# Patient Record
Sex: Male | Born: 2000 | Race: White | Hispanic: No | Marital: Single | Smoking: Never smoker
Health system: Southern US, Community
[De-identification: ages and names within clinical notes are randomized; demographics above are authoritative.]

## PROBLEM LIST (undated history)

## (undated) DIAGNOSIS — I10 Essential (primary) hypertension: Secondary | ICD-10-CM

## (undated) DIAGNOSIS — Z8774 Personal history of (corrected) congenital malformations of heart and circulatory system: Secondary | ICD-10-CM

## (undated) DIAGNOSIS — R197 Diarrhea, unspecified: Secondary | ICD-10-CM

## (undated) HISTORY — DX: Personal history of (corrected) congenital malformations of heart and circulatory system: Z87.74

## (undated) HISTORY — PX: CORONARY STENT PLACEMENT: SHX1402

## (undated) HISTORY — DX: Diarrhea, unspecified: R19.7

## (undated) HISTORY — PX: COARCTATION OF AORTA REPAIR: SHX261

## (undated) HISTORY — DX: Essential (primary) hypertension: I10

---
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
## 2019-08-03 ENCOUNTER — Telehealth: Payer: Self-pay | Admitting: Cardiovascular Disease

## 2019-08-03 MED ORDER — ENALAPRIL MALEATE 20 MG PO TABS: 20.0000 mg | ORAL_TABLET | Freq: Every day | ORAL | 0 refills | Status: DC

## 2019-08-03 MED ORDER — ATENOLOL 50 MG PO TABS: 50.0000 mg | ORAL_TABLET | Freq: Every day | ORAL | 0 refills | Status: DC

## 2019-08-03 NOTE — Telephone Encounter (Signed)
  *  STAT* If patient is at the pharmacy, call can be transferred to refill team.   1. Which medications need to be refilled? (please list name of each medication and dose if known) atenolol (TENORMIN) 50 MG tablet enalapril (VASOTEC) 20 MG tablet  2. Which pharmacy/location (including street and city if local pharmacy) is medication to be sent to? [facility] - [place], [place] - [address]  3. Do they need a 30 day or 90 day supply? 90 days

## 2019-08-03 NOTE — Telephone Encounter (Signed)
Pt's medication was sent to pt's pharmacy as requested. Confirmation received.  °

## 2019-08-17 ENCOUNTER — Telehealth: Payer: Self-pay | Admitting: Cardiovascular Disease

## 2019-08-17 NOTE — Telephone Encounter (Signed)
I have referred him to the [facility] since we do not follow patients with coarctation of the aorta .  Copied from my July 2020 phone note with his father:  .   I have discussed with partners and with [facility]. None of our partners follow Coarctation of the aorta regularly and Wesley Estrada should be followed by an Adult Congenital Heart disease specialist.    Please make a referral to the [facility]. Dr. Darlis Loan or Dr. Payton Emerald or other appropriate partner.    [address] [place], [place]  [address]    Please fax my last office visit and any other available records. .   I have informed the [facility] scheduler that we do not have imaging or much information that is not already available in Care Everywhere.    I have spoken to Wesley Estrada's father Wesley Estrada) who appreciates the referral to [facility] . I have asked Wesley Estrada to cancel the CT angio scheduled for November 26, 2018 and have informed the patient not to show up for that.      I will be happy to see him for any emergencies until he is established with the [facility] .      Phone [phone]  He needs to follow up with Dr. Mayer Camel or Dr. Morene Rankins .   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   Thanks   PN

## 2019-08-17 NOTE — Telephone Encounter (Signed)
New message  Patient is calling in to speak with Dr. Elease Hashimoto. States that he has some questions that he would like answered by Dr. Elease Hashimoto. Please give patient a call back to assist.

## 2019-08-17 NOTE — Telephone Encounter (Signed)
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  He would like to know with his cardiac hx (aorta stenting & HTN) if this would prevent him from obtaining license and/or clearance from cardiology standpoint. He doesn't want to go through process if cardiology feels he would be unsafe/clear him. Pt aware Dr. Elease Hashimoto and his nurse are out of the office today but will forward to them to address upon return to office. Aware we will call back once Nahser has reviewed.

## 2019-08-17 NOTE — Telephone Encounter (Addendum)
Pt made aware of Dr. Elease Hashimoto comment/previous referral. Pt states that at the time he did not have insurance and was unable to afford visit.  He had insurance now and would like referral again to get process going again. Will forward to Eligha Bridegroom, RN to aid in referral and let pt know when completed.  He also is due for his yearly follow up and would like to schedule that when she calls.  (he has insurance for about 6 months, and states they will not cover a visit before then, so schedule for June or after).  Thirdly, he wants to make sure that Dr. Elease Hashimoto feels that he would approve him for CDL with his HTN hx.  He understands Marcelino Duster will discuss this with Nahser and let him know.  He understands that only the [facility] (once seen/established) will be able to give clearance from aorta aspect of medical hx. He understands Marcelino Duster will follow up with him when she returns to the office.   Would be able to do, or if I am just wasting my time

## 2019-08-18 ENCOUNTER — Encounter: Payer: Self-pay | Admitting: Nurse Practitioner

## 2019-08-18 NOTE — Telephone Encounter (Signed)
Referral faxed to [facility] [phone].

## 2019-08-18 NOTE — Telephone Encounter (Signed)
Spoke with patient and advised him that per Dr. 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 He reports SBP is ranging from 112-115 mmHg on medication. He is aware that I will fax the referral to [facility] and that he should get a call from them to schedule an appointment. I advised him to call back with questions or concerns and he thanked me for the call.

## 2019-08-29 ENCOUNTER — Ambulatory Visit: Payer: Self-pay | Admitting: Cardiovascular Disease

## 2019-10-31 ENCOUNTER — Other Ambulatory Visit: Payer: Self-pay | Admitting: *Deleted

## 2019-10-31 MED ORDER — ENALAPRIL MALEATE 20 MG PO TABS: 20.0000 mg | ORAL_TABLET | Freq: Every day | ORAL | 0 refills | Status: DC

## 2019-11-11 ENCOUNTER — Encounter: Payer: Self-pay | Admitting: Cardiovascular Disease

## 2019-11-11 ENCOUNTER — Other Ambulatory Visit: Payer: Self-pay

## 2019-11-11 ENCOUNTER — Ambulatory Visit: Payer: BC Managed Care – PPO | Admitting: Cardiovascular Disease

## 2019-11-11 VITALS — BP 102/66 | HR 52 | Ht 65.0 in | Wt 143.8 lb

## 2019-11-11 DIAGNOSIS — Q251 Coarctation of aorta: Secondary | ICD-10-CM | POA: Diagnosis not present

## 2019-11-11 LAB — BASIC METABOLIC PANEL
BUN/Creatinine Ratio: 20 (ref 9–20)
BUN: 17 mg/dL (ref 6–20)
CO2: 23 mmol/L (ref 20–29)
Calcium: 9.4 mg/dL (ref 8.7–10.2)
Chloride: 102 mmol/L (ref 96–106)
Creatinine, Ser: 0.87 mg/dL (ref 0.76–1.27)
GFR calc Af Amer: 145 mL/min/{1.73_m2} (ref >59)
GFR calc non Af Amer: 125 mL/min/{1.73_m2} (ref >59)
Glucose: 80 mg/dL (ref 65–99)
Potassium: 4.3 mmol/L (ref 3.5–5.2)
Sodium: 138 mmol/L (ref 134–144)

## 2019-11-11 NOTE — Patient Instructions (Addendum)
Medication Instructions:  No changes *If you need a refill on your cardiac medications before your next appointment, please call your pharmacy*   Lab Work: Today---bmet  Testing/Procedures: CT Angiography (CTA), is a special type of CT scan that uses a computer to produce multi-dimensional views of major blood vessels throughout the body. In CT angiography, a contrast material is injected through an IV to help visualize the blood vessels  Follow-Up: Your physician recommends that you schedule a follow-up appointment AS NEEDED WITH DR. Elease Hashimoto.   Other Instructions

## 2019-11-11 NOTE — Progress Notes (Signed)
Cardiology Office Note:    Date:  11/11/2019   ID:  Wesley Estrada, DOB 08/13/2000, MRN [id number]  PCP:  Pennie Rushing, MD  Cardiologist: Reola Calkins Kenrick Pore  Electrophysiologist:  None   Referring MD: Pennie Rushing, MD   Chief Complaint  Patient presents with   Follow-up    History of Present Illness:    Wesley Estrada is a 19 y.o. male with a hx of coarctation of aorta.   Surgical correctinat WFBU at age 1 monhts. Had stenting of the aorta age 24.    Has had CT angio yearly to follow the aortic stent.  Last CT angio was a year ago   Active.  Has taken welding classes.   Works in [employer] now   No CP or dyspnea  takes his BP regularly . 120/68 typically.   Non smoker, no ETOH.    November 11, 2019: Body seen today for follow-up of history of coarctation of his aorta with surgical correction.  He has been referred to the [facility]. We ordered a CT angiogram of the chest and aorta with and without contrast but this has not been done yet.  He did not have insurance last year so he has not gotten the CT scan or seen Dr. Vella Kohler at [facility] congenital [facility] clinic .   No CP or dyspnea.     Past Medical History:  Diagnosis Date   Diarrhea    H/O coarctation of aorta    HTN (hypertension)     Past Surgical History:  Procedure Laterality Date   COARCTATION OF AORTA REPAIR     CORONARY STENT PLACEMENT      Current Medications: Current Meds  Medication Sig   amoxicillin (AMOXIL) 500 MG capsule Take 4 capsules by mouth as needed. Take 1 hr prior to dental appt   atenolol (TENORMIN) 50 MG tablet Take 1 tablet (50 mg total) by mouth daily. Please keep upcoming appt in June with Dr. Acie Fredrickson for future refills. Thank you   enalapril (VASOTEC) 20 MG tablet Take 1 tablet (20 mg total) by mouth daily. Please keep upcoming appt for further refills. Thank you     Allergies:   Patient has no known allergies.   Social History   Socioeconomic History   Marital  status: Single    Spouse name: Not on file   Number of children: Not on file   Years of education: Not on file   Highest education level: Not on file  Occupational History   Not on file  Tobacco Use   Smoking status: Never Smoker   Smokeless tobacco: Never Used  Substance and Sexual Activity   Alcohol use: Not on file   Drug use: Not on file   Sexual activity: Not on file  Other Topics Concern   Not on file  Social History Narrative   Not on file   Social Determinants of Health   Financial Resource Strain:    Difficulty of Paying Living Expenses:   Food Insecurity:    Worried About [place] in the Last Year:    [place] in the Last Year:   Transportation Needs:    Film/video editor (Medical):    Lack of Transportation (Non-Medical):   Physical Activity:    Days of Exercise per Week:    Minutes of Exercise per Session:   Stress:    Feeling of Stress :   Social Connections:    Frequency  of Communication with Friends and Family:    Frequency of Social Gatherings with Friends and Family:    Attends Religious Services:    Active Member of Clubs or Organizations:    Attends Engineer, structural:    Marital Status:      Family History: The patient's family history includes Heart attack in his maternal grandmother and mother; Heart disease in his maternal grandmother; Hypertension in his father, maternal aunt, maternal grandfather, maternal grandmother, and mother.  ROS:   Please see the history of present illness.     All other systems reviewed and are negative.  EKGs/Labs/Other Studies Reviewed:    The following studies were reviewed today:   EKG:   November 11, 2019: Sinus bradycardia 53.  No ST or T wave changes.  Recent Labs: No results found for requested labs within last 8760 hours.  Recent Lipid Panel No results found for: CHOL, TRIG, HDL, CHOLHDL, VLDL, LDLCALC, LDLDIRECT  Physical Exam:     Physical  Exam: Blood pressure 102/66, pulse (!) 52, height 5\' 5"  (1.651 m), weight 143 lb 12.8 oz (65.2 kg), SpO2 98 %.  GEN:  Well nourished, well developed in no acute distress HEENT: Normal NECK: No JVD; No carotid bruits LYMPHATICS: No lymphadenopathy CARDIAC: RRR , no murmurs, rubs, gallops RESPIRATORY:  Clear to auscultation without rales, wheezing or rhonchi  ABDOMEN: Soft, non-tender, non-distended MUSCULOSKELETAL:  No edema; No deformity .  Reduced distal foot pulses.  SKIN: Warm and dry NEUROLOGIC:  Alert and oriented x 3     ASSESSMENT:    1. Coarctation of aorta    PLAN:    In order of problems listed above:  1. Coarctation of descending aorta :   S/p surgical repair at 18 months and then stenting at age 89.    Asymptomatic.   His last echo was with Dr. 14 in 2019 .    He has a persistent aortic gradient of 38 mmHg with a mean gradient of 20 mmHg.   Will get a CT scan Will refer him again to Dr. 2020 for further follow up   He needs CDL paperwork filled out.   I've agreed to fill out the cardiology section of this for this year.   He will need to have Dr. Mayo Ao complete this in future years.      Medication Adjustments/Labs and Tests Ordered: Current medicines are reviewed at length with the patient today.  Concerns regarding medicines are outlined above.  Orders Placed This Encounter  Procedures   CT Angio Chest/Abd/Pel for Dissection W and/or W/WO   Basic metabolic panel   Ambulatory referral to Cardiology   EKG 12-Lead   No orders of the defined types were placed in this encounter.      Patient Instructions  Medication Instructions:  No changes *If you need a refill on your cardiac medications before your next appointment, please call your pharmacy*   Lab Work: Today---bmet  Testing/Procedures: CT Angiography (CTA), is a special type of CT scan that uses a computer to produce multi-dimensional views of major blood vessels throughout the  body. In CT angiography, a contrast material is injected through an IV to help visualize the blood vessels  Follow-Up: Your physician recommends that you schedule a follow-up appointment AS NEEDED WITH DR. Mayo Ao.   Other Instructions      Signed, Elease Hashimoto, MD  11/11/2019 10:47 AM    [facility]

## 2019-11-16 ENCOUNTER — Ambulatory Visit (INDEPENDENT_AMBULATORY_CARE_PROVIDER_SITE_OTHER)
Admission: RE | Admit: 2019-11-16 | Discharge: 2019-11-16 | Disposition: A | Discharge status: Home / Self Care | Payer: BC Managed Care – PPO | Source: Ambulatory Visit | Attending: Cardiovascular Disease | Admitting: Cardiovascular Disease

## 2019-11-16 ENCOUNTER — Other Ambulatory Visit: Payer: Self-pay

## 2019-11-16 DIAGNOSIS — Q251 Coarctation of aorta: Secondary | ICD-10-CM

## 2019-11-16 MED ORDER — IOHEXOL 350 MG/ML SOLN
100.0000 mL | Freq: Once | INTRAVENOUS | Status: AC | PRN
  Administered 2019-11-16: 100 mL via INTRAVENOUS

## 2019-11-17 ENCOUNTER — Telehealth: Payer: Self-pay | Admitting: Cardiovascular Disease

## 2019-11-17 NOTE — Telephone Encounter (Signed)
I spoke with patient and reviewed results with him

## 2019-11-17 NOTE — Telephone Encounter (Signed)
Patient returning call for CT results. 

## 2019-12-30 ENCOUNTER — Other Ambulatory Visit: Payer: Self-pay | Admitting: Cardiovascular Disease

## 2020-02-29 ENCOUNTER — Other Ambulatory Visit: Payer: Self-pay | Admitting: Cardiovascular Disease

## 2020-08-23 ENCOUNTER — Other Ambulatory Visit: Payer: Self-pay | Admitting: Cardiovascular Disease

## 2020-08-23 DIAGNOSIS — I10 Essential (primary) hypertension: Secondary | ICD-10-CM

## 2020-09-18 ENCOUNTER — Other Ambulatory Visit: Payer: Self-pay | Admitting: Cardiovascular Disease

## 2020-09-18 DIAGNOSIS — I10 Essential (primary) hypertension: Secondary | ICD-10-CM

## 2020-10-14 ENCOUNTER — Other Ambulatory Visit: Payer: Self-pay | Admitting: Cardiovascular Disease

## 2020-10-14 DIAGNOSIS — I10 Essential (primary) hypertension: Secondary | ICD-10-CM

## 2020-11-15 ENCOUNTER — Other Ambulatory Visit: Payer: Self-pay | Admitting: Cardiovascular Disease

## 2020-11-15 DIAGNOSIS — I10 Essential (primary) hypertension: Secondary | ICD-10-CM

## 2020-11-30 ENCOUNTER — Telehealth: Payer: Self-pay | Admitting: Cardiovascular Disease

## 2020-11-30 DIAGNOSIS — I10 Essential (primary) hypertension: Secondary | ICD-10-CM

## 2020-11-30 MED ORDER — ENALAPRIL MALEATE 20 MG PO TABS: 20.0000 mg | ORAL_TABLET | Freq: Every day | ORAL | 0 refills | Status: DC

## 2020-11-30 MED ORDER — ATENOLOL 50 MG PO TABS: 50.0000 mg | ORAL_TABLET | Freq: Every day | ORAL | 0 refills | Status: DC

## 2020-11-30 NOTE — Telephone Encounter (Signed)
*  STAT* If patient is at the pharmacy, call can be transferred to refill team.   1. Which medications need to be refilled? (please list name of each medication and dose if known)  atenolol (TENORMIN) 50 MG tablet enalapril (VASOTEC) 20 MG tablet  2. Which pharmacy/location (including street and city if local pharmacy) is medication to be sent to? [id number] - [place], [place] - [address]  3. Do they need a 30 day or 90 day supply? Needs enough medication to last him until 10/04/ appointment.   Took last tablet today.

## 2020-11-30 NOTE — Telephone Encounter (Signed)
Pt's medications were sent to pt's pharmacy as requested. Confirmation received.  

## 2021-02-19 ENCOUNTER — Other Ambulatory Visit: Payer: Self-pay

## 2021-02-19 ENCOUNTER — Encounter: Payer: Self-pay | Admitting: Cardiovascular Disease

## 2021-02-19 ENCOUNTER — Ambulatory Visit (INDEPENDENT_AMBULATORY_CARE_PROVIDER_SITE_OTHER): Payer: Self-pay | Admitting: Cardiovascular Disease

## 2021-02-19 VITALS — BP 128/72 | HR 65 | Ht 65.0 in | Wt 155.0 lb

## 2021-02-19 DIAGNOSIS — I10 Essential (primary) hypertension: Secondary | ICD-10-CM

## 2021-02-19 DIAGNOSIS — Q251 Coarctation of aorta: Secondary | ICD-10-CM

## 2021-02-19 MED ORDER — ENALAPRIL MALEATE 20 MG PO TABS: 20.0000 mg | ORAL_TABLET | Freq: Every day | ORAL | 3 refills | Status: DC

## 2021-02-19 MED ORDER — ATENOLOL 50 MG PO TABS: 50.0000 mg | ORAL_TABLET | Freq: Every day | ORAL | 0 refills | Status: DC

## 2021-02-19 NOTE — Progress Notes (Signed)
Cardiology Office Note:    Date:  02/19/2021   ID:  Wesley Estrada, DOB 17-May-2001, MRN [id number]  PCP:  Pcp, No  Cardiologist: New , Deema Juncaj  Electrophysiologist:  None   Referring MD: No ref. provider found   Chief Complaint  Patient presents with   coarctation of aorta    History of Present Illness:    Wesley Estrada is a 20 y.o. male with a hx of coarctation of aorta.   Surgical correctinat [facility]BU at age 84 monhts. Had stenting of the aorta age 14.    Has had CT angio yearly to follow the aortic stent.  Last CT angio was a year ago   Active.  Has taken welding classes.   Works in [employer] now   No CP or dyspnea  takes his BP regularly . 120/68 typically.   Non smoker, no ETOH.    November 11, 2019: [name] seen today for follow-up of history of coarctation of his aorta with surgical correction.  He has been referred to the [facility]. We ordered a CT angiogram of the chest and aorta with and without contrast but this has not been done yet.  He did not have insurance last year so he has not gotten the CT scan or seen Dr. Mayo Ao at [facility] .   No CP or dyspnea.    February 19, 2021:  [name]  seen today for follow-up of his coarctation of his aorta. He was referred to the [facility] and was to follow up with them  No syncope, no CP  Endurance is good  Needs refills on his atenolol and enalapril     Past Medical History:  Diagnosis Date   Diarrhea    H/O coarctation of aorta    HTN (hypertension)     Past Surgical History:  Procedure Laterality Date   COARCTATION OF AORTA REPAIR     CORONARY STENT PLACEMENT      Current Medications: Current Meds  Medication Sig   amoxicillin (AMOXIL) 500 MG capsule Take 4 capsules by mouth as needed. Take 1 hr prior to dental appt   EPINEPHrine 0.3 mg/0.3 mL IJ SOAJ injection Inject 0.3 mg into the muscle as directed.   [DISCONTINUED] atenolol (TENORMIN) 50 MG  tablet Take 1 tablet (50 mg total) by mouth daily. Please keep upcoming appt in October 2022 with Dr. Elease Hashimoto before anymore refills. Thank you Final Attempt   [DISCONTINUED] enalapril (VASOTEC) 20 MG tablet Take 1 tablet (20 mg total) by mouth daily. Please keep upcoming appt with Dr. Elease Hashimoto in October 2022 before anymore refills. Thank you Final Attempt     Allergies:   Poison ivy extract and Poison oak extract   Social History   Socioeconomic History   Marital status: Single    Spouse name: Not on file   Number of children: Not on file   Years of education: Not on file   Highest education level: Not on file  Occupational History   Not on file  Tobacco Use   Smoking status: Never   Smokeless tobacco: Never  Substance and Sexual Activity   Alcohol use: Not on file   Drug use: Not on file   Sexual activity: Not on file  Other Topics Concern   Not on file  Social History Narrative   Not on file   Social Determinants of Health   Financial Resource Strain: Not on file  Food Insecurity: Not  on file  Transportation Needs: Not on file  Physical Activity: Not on file  Stress: Not on file  Social Connections: Not on file     Family History: The patient's family history includes Heart attack in his maternal grandmother and mother; Heart disease in his maternal grandmother; Hypertension in his father, maternal aunt, maternal grandfather, maternal grandmother, and mother.  ROS:   Please see the history of present illness.     All other systems reviewed and are negative.  EKGs/Labs/Other Studies Reviewed:    The following studies were reviewed today:   EKG:    Oct. 4, 2022:   [employer] RBBB  Recent Labs: No results found for requested labs within last 8760 hours.  Recent Lipid Panel No results found for: CHOL, TRIG, HDL, CHOLHDL, VLDL, LDLCALC, LDLDIRECT  Physical Exam:    Physical Exam: Blood pressure 128/72, pulse 65, height 5\' 5"  (1.651 m), weight 155 lb (70.3 kg),  SpO2 97 %.  GEN:  Well nourished young man,  well developed in no acute distress HEENT: Normal NECK: No JVD; No carotid bruits LYMPHATICS: No lymphadenopathy CARDIAC: RRR   RESPIRATORY:  Clear to auscultation without rales, wheezing or rhonchi  ABDOMEN: Soft, non-tender, non-distended MUSCULOSKELETAL:  No edema; No deformity  SKIN: Warm and dry NEUROLOGIC:  Alert and oriented x 3     ASSESSMENT:    1. Coarctation of aorta   2. Primary hypertension     PLAN:    In order of problems listed above:  Coarctation of descending aorta :   S/p surgical repair at 18 months and then stenting at age 22.    Asymptomatic.   His last echo was with Dr. 14 in 2019 .    He has a persistent aortic gradient of 38 mmHg with a mean gradient of 20 mmHg.   Will get a CT scan He has seen the [facility] on the 2nd floor or our building . I've encourage him to return for follow up with the [facility] clinic. Ive asked him to request that they assume responsibility for his atenolol and enalapril .  Will see him on an as needed basis .    Medication Adjustments/Labs and Tests Ordered: Current medicines are reviewed at length with the patient today.  Concerns regarding medicines are outlined above.  Orders Placed This Encounter  Procedures   EKG 12-Lead    Meds ordered this encounter  Medications   enalapril (VASOTEC) 20 MG tablet    Sig: Take 1 tablet (20 mg total) by mouth daily.    Dispense:  90 tablet    Refill:  3    Order Specific Question:   Supervising Provider    Answer:   2020, Nader Boys J [[id number]]   atenolol (TENORMIN) 50 MG tablet    Sig: Take 1 tablet (50 mg total) by mouth daily.    Dispense:  90 tablet    Refill:  0    Order Specific Question:   Supervising Provider    Answer:   Elease Hashimoto [phone]        Patient Instructions  Medication Instructions:  Your physician recommends that you continue on your current medications as directed. Please  refer to the Current Medication list given to you today.  *If you need a refill on your cardiac medications before your next appointment, please call your pharmacy*   Lab Work: None Ordered If you have labs (blood work) drawn today and your tests are completely  normal, you will receive your results only by: MyChart Message (if you have MyChart) OR A paper copy in the mail If you have any lab test that is abnormal or we need to change your treatment, we will call you to review the results.   Testing/Procedures: None Ordered   Follow-Up: At [facility], you and your health needs are our priority.  As part of our continuing mission to provide you with exceptional heart care, we have created designated Provider Care Teams.  These Care Teams include your primary Cardiologist (physician) and Advanced Practice Providers (APPs -  Physician Assistants and Nurse Practitioners) who all work together to provide you with the care you need, when you need it.  We recommend signing up for the patient portal called "MyChart".  Sign up information is provided on this After Visit Summary.  MyChart is used to connect with patients for Virtual Visits (Telemedicine).  Patients are able to view lab/test results, encounter notes, upcoming appointments, etc.  Non-urgent messages can be sent to your provider as well.   To learn more about what you can do with MyChart, go to [email or web address].    Your next appointment:     As Needed  The format for your next appointment:   In Person  Provider:   You may see Kristeen Miss, MD or one of the following Advanced Practice Providers on your designated Care Team:   Tereso Newcomer, PA-C [place] Aus, [place]  Call [facility] [address]   Signed, Kristeen Miss, MD  02/19/2021 2:54 PM    [facility]

## 2021-02-19 NOTE — Patient Instructions (Signed)
Medication Instructions:  Your physician recommends that you continue on your current medications as directed. Please refer to the Current Medication list given to you today.  *If you need a refill on your cardiac medications before your next appointment, please call your pharmacy*   Lab Work: None Ordered If you have labs (blood work) drawn today and your tests are completely normal, you will receive your results only by: MyChart Message (if you have MyChart) OR A paper copy in the mail If you have any lab test that is abnormal or we need to change your treatment, we will call you to review the results.   Testing/Procedures: None Ordered   Follow-Up: At [facility], you and your health needs are our priority.  As part of our continuing mission to provide you with exceptional heart care, we have created designated Provider Care Teams.  These Care Teams include your primary Cardiologist (physician) and Advanced Practice Providers (APPs -  Physician Assistants and Nurse Practitioners) who all work together to provide you with the care you need, when you need it.  We recommend signing up for the patient portal called "MyChart".  Sign up information is provided on this After Visit Summary.  MyChart is used to connect with patients for Virtual Visits (Telemedicine).  Patients are able to view lab/test results, encounter notes, upcoming appointments, etc.  Non-urgent messages can be sent to your provider as well.   To learn more about what you can do with MyChart, go to [email or web address].    Your next appointment:     As Needed  The format for your next appointment:   In Person  Provider:   You may see Kristeen Miss, MD or one of the following Advanced Practice Providers on your designated Care Team:   Tereso Newcomer, PA-C Chelsea Aus, [place]  Call [facility] [address] [phone]

## 2021-02-28 ENCOUNTER — Other Ambulatory Visit: Payer: Self-pay | Admitting: Cardiovascular Disease

## 2021-02-28 DIAGNOSIS — I10 Essential (primary) hypertension: Secondary | ICD-10-CM

## 2021-03-21 ENCOUNTER — Other Ambulatory Visit: Payer: Self-pay | Admitting: Cardiovascular Disease

## 2021-05-01 IMAGING — CT CT CTA ABD/PEL W/CM AND/OR W/O CM
2 of 7 series · 12 of 46 positions shown, 14 images · non-contrast
Comparison: None.

CLINICAL DATA: 18-year-old male with a history of coarctation of
the aorta. Patient underwent placement of a stent at [facility] approximately 7 years ago. He is now being referred
to [facility] for further evaluation and requires CT imaging prior to
referral.

EXAM:
CT ANGIOGRAPHY CHEST, ABDOMEN AND PELVIS
TECHNIQUE: Non-contrast CT of the chest was initially obtained.

[Series 4: aorta 3.0 bf37 2 · axial · 0.68mm/px · z∈[-590,-50]mm · 9 of 215 slices shown, 11 images]
[im 23/215  soft-tissue]
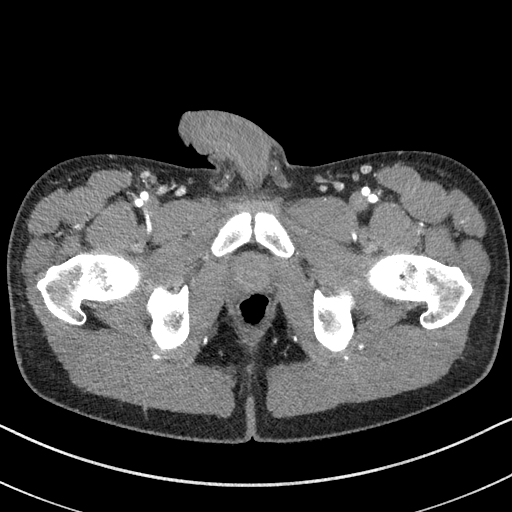
[im 23/215  bone]
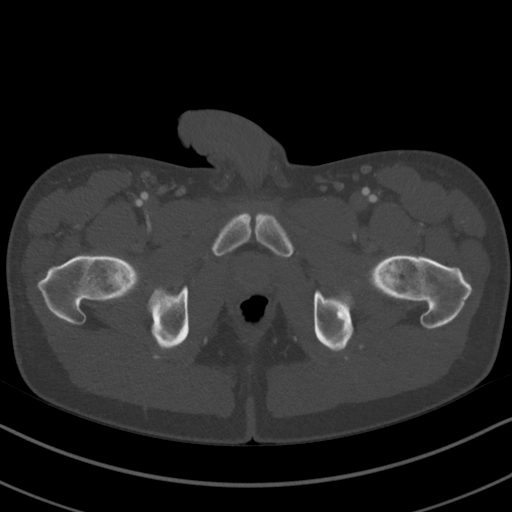
[im 46/215  soft-tissue]
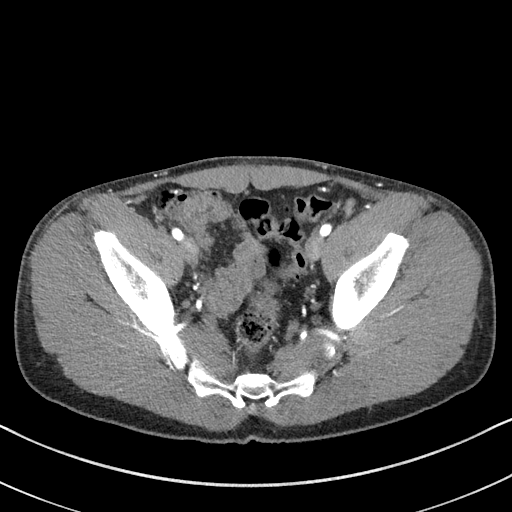
[im 68/215  soft-tissue]
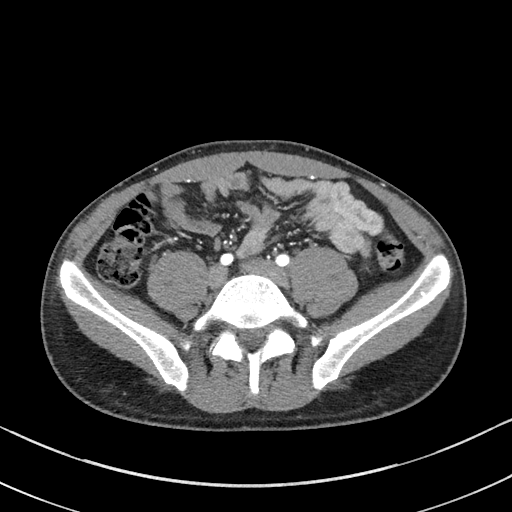
[im 91/215  soft-tissue]
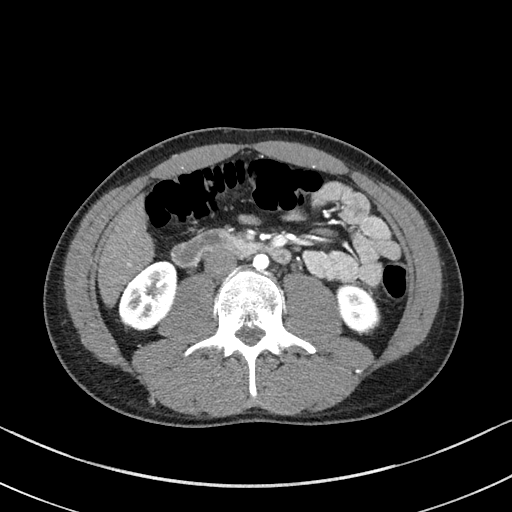
[im 113/215  soft-tissue]
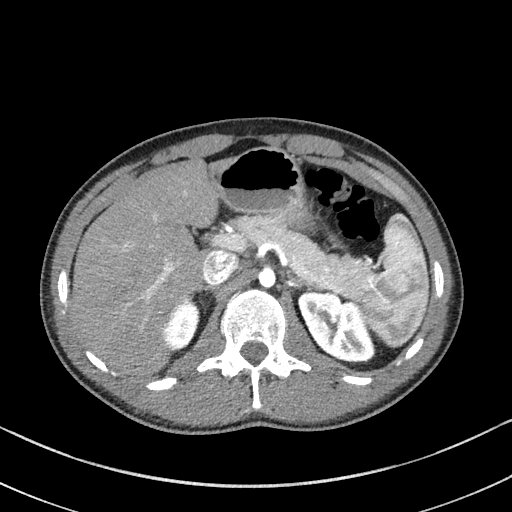
[im 136/215  soft-tissue]
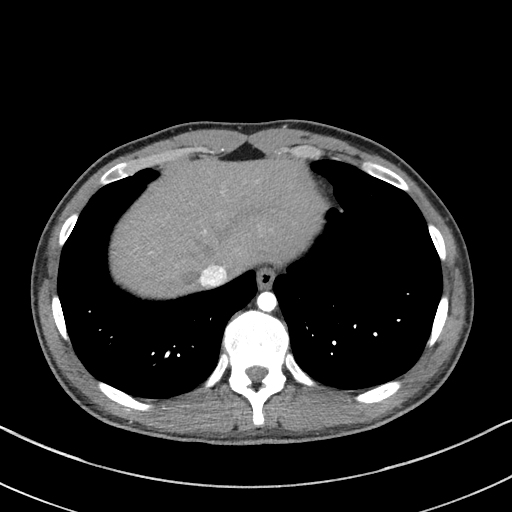
[im 158/215  soft-tissue]
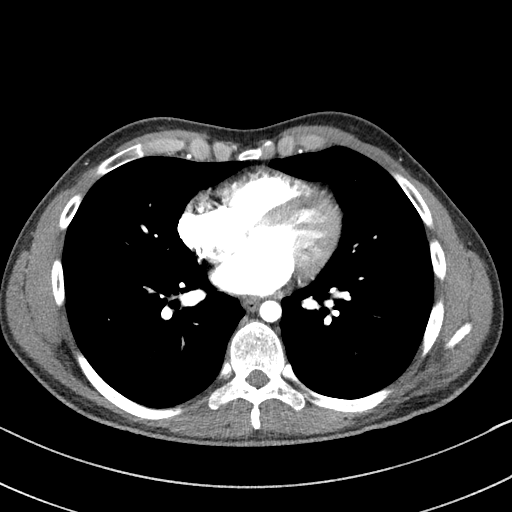
[im 181/215  soft-tissue]
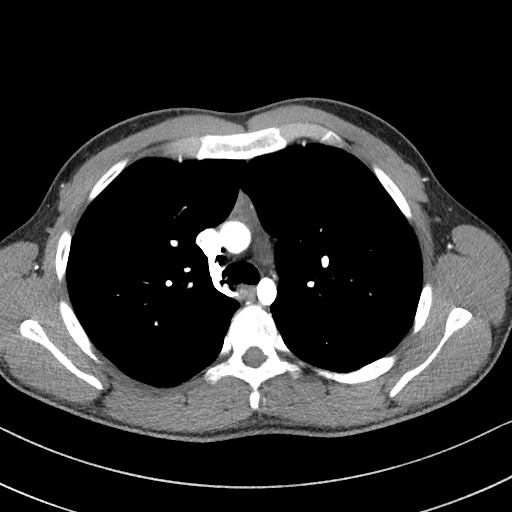
[im 203/215  soft-tissue]
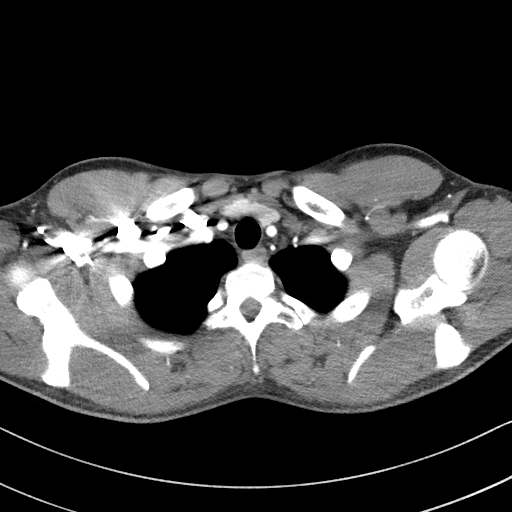
[im 203/215  bone]
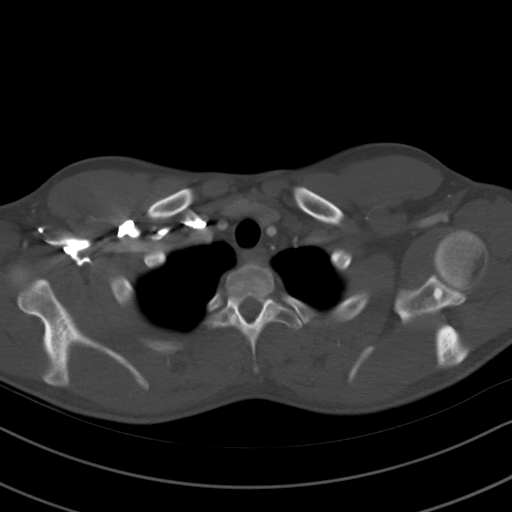

[Series 7: coronals · coronal · 0.64mm/px · 3 of 108 slices shown]
[im 27/108  soft-tissue]
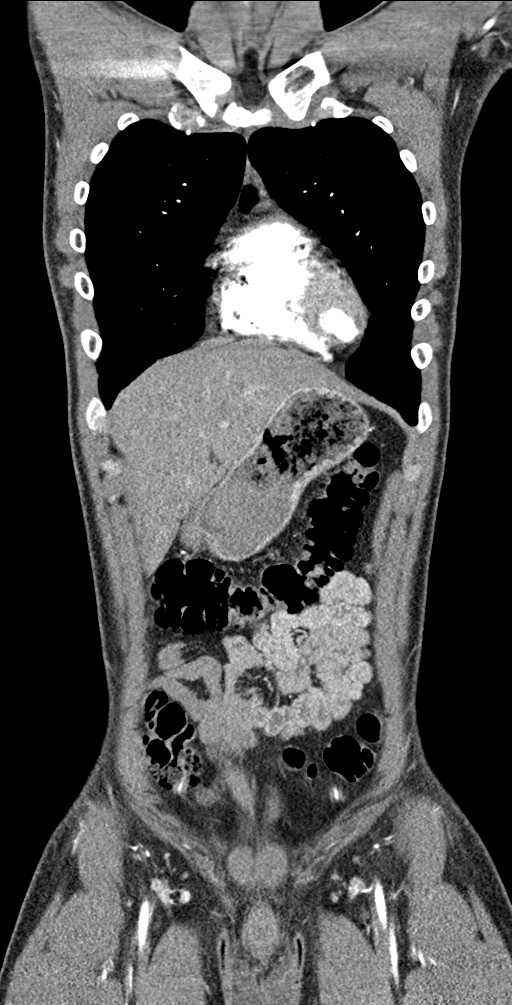
[im 54/108  soft-tissue]
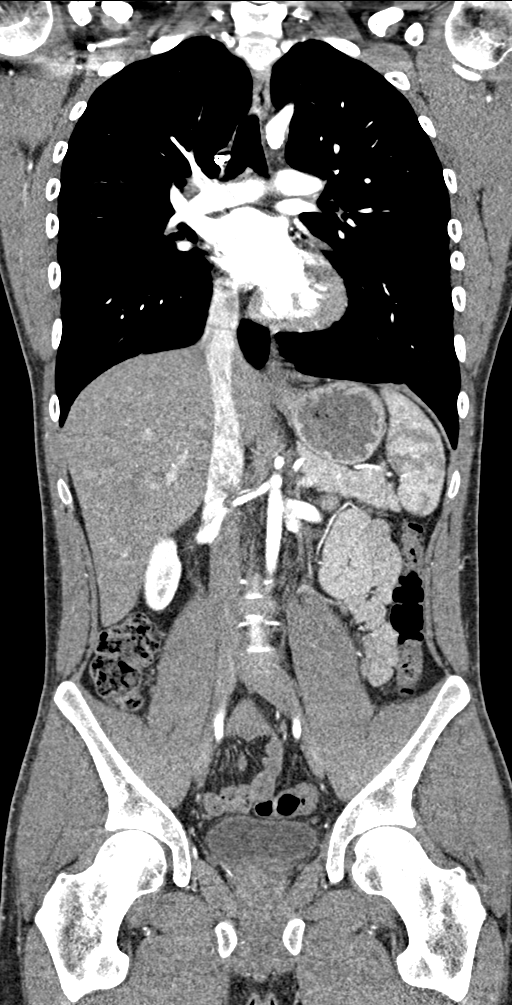
[im 81/108  soft-tissue]
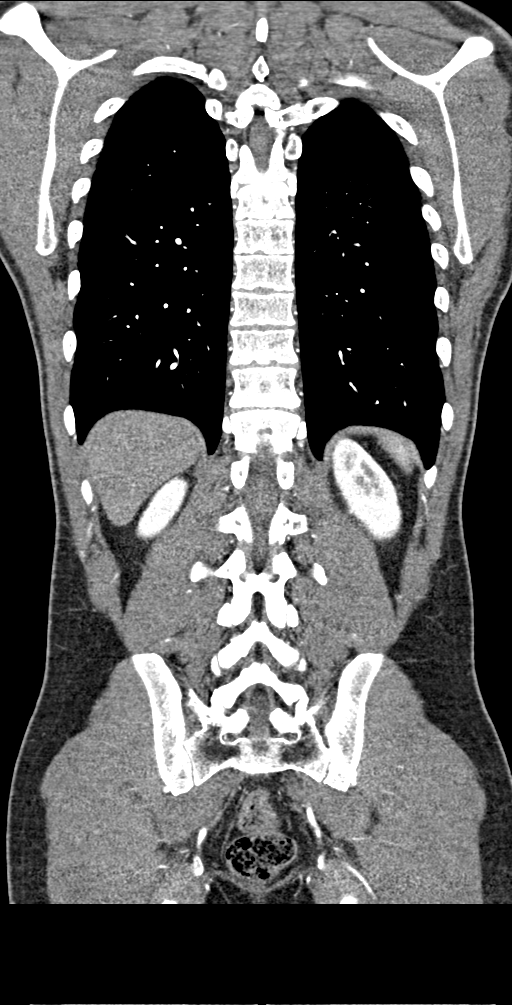

[12 of 46 positions shown; findings below may reference images not displayed]

Multidetector CT imaging through the chest, abdomen and pelvis was
performed using the standard protocol during bolus administration of
intravenous contrast. Multiplanar reconstructed images and MIPs were
obtained and reviewed to evaluate the vascular anatomy.

CONTRAST:  100mL OMNIPAQUE IOHEXOL 350 MG/ML SOLN
FINDINGS: CTA CHEST FINDINGS

Cardiovascular: Variant 3 vessel arch anatomy. The right
brachiocephalic and left common carotid artery share a common
origin. The left vertebral artery arises directly from the aorta.
Normal left subclavian artery. The aortic root, ascending aorta and
arch are otherwise unremarkable. A metallic stent is present in the
aortic isthmus beginning just beyond the origin of the left
subclavian artery. The stent is well apposed to the aortic isthmus
and measures 12 mm in diameter. No evidence of aortic wall
irregularity, in stent stenosis, dissection or thrombus formation.
The stent appears well positioned and well sized to the native
aorta.

Normal appearing coronary artery anatomy. The heart is normal in
size. No pericardial effusion. Normal caliber main and central
pulmonary arteries. No evidence of PE.

Mediastinum/Nodes: Unremarkable CT appearance of the thyroid gland.
No suspicious mediastinal or hilar adenopathy. No soft tissue
mediastinal mass. The thoracic esophagus is unremarkable. Triangular
soft tissue in the anterior mediastinum consistent with residual
thymus, not unexpected in this demographic.

Lungs/Pleura: Lungs are clear. No pleural effusion or pneumothorax.

Musculoskeletal: Pectus carinatum deformity of the sternum. No acute
or significant osseous findings.

Review of the MIP images confirms the above findings.

CTA ABDOMEN AND PELVIS FINDINGS

VASCULAR

Aorta: Normal caliber aorta without aneurysm, dissection, vasculitis
or significant stenosis.

Celiac: Patent without evidence of aneurysm, dissection, vasculitis
or significant stenosis.

SMA: Patent without evidence of aneurysm, dissection, vasculitis or
significant stenosis.

Renals: Both main renal arteries are patent without evidence of
aneurysm, dissection, vasculitis, fibromuscular dysplasia or
significant stenosis. Small accessory artery to the left upper pole.

IMA: Patent without evidence of aneurysm, dissection, vasculitis or
significant stenosis.

Inflow: Patent without evidence of aneurysm, dissection, vasculitis
or significant stenosis.

Veins: No obvious venous abnormality within the limitations of this
arterial phase study.

Review of the MIP images confirms the above findings.

NON-VASCULAR

Hepatobiliary: No focal liver abnormality is seen. No gallstones,
gallbladder wall thickening, or biliary dilatation.

Pancreas: Unremarkable. No pancreatic ductal dilatation or
surrounding inflammatory changes.

Spleen: Normal in size without focal abnormality.

Adrenals/Urinary Tract: Adrenal glands are unremarkable. Kidneys are
normal, without renal calculi, focal lesion, or hydronephrosis.
Bladder is unremarkable.

Stomach/Bowel: Stomach is within normal limits. Appendix appears
normal. No evidence of bowel wall thickening, distention, or
inflammatory changes.

Lymphatic: No suspicious lymphadenopathy.

Reproductive: Prostate is unremarkable.

Other: No abdominal wall hernia or abnormality. No abdominopelvic
ascites.

Musculoskeletal: No acute or significant osseous findings.

Review of the MIP images confirms the above findings.
IMPRESSION: 1. Well positioned and well sized stent in the aortic isthmus at the
site of presumed prior aortic coarctation. No evidence of
complication.
2. Incidental note is made of variant anatomy of the aortic arch
with a common origin of the right brachiocephalic and left common
carotid arteries and left vertebral artery arising directly from the
aorta.
3. Small accessory renal artery to the left upper pole.
4. Other than these incidental anatomic variance, no significant
additional arterial abnormality is identified.
5. Pectus carinatum.

## 2021-06-11 ENCOUNTER — Other Ambulatory Visit: Payer: Self-pay | Admitting: Cardiovascular Disease

## 2022-03-20 ENCOUNTER — Other Ambulatory Visit: Payer: Self-pay | Admitting: Cardiovascular Disease

## 2022-03-20 DIAGNOSIS — I10 Essential (primary) hypertension: Secondary | ICD-10-CM

## 2022-04-22 ENCOUNTER — Other Ambulatory Visit: Payer: Self-pay | Admitting: Cardiovascular Disease

## 2022-04-22 DIAGNOSIS — I10 Essential (primary) hypertension: Secondary | ICD-10-CM

## 2022-05-09 ENCOUNTER — Other Ambulatory Visit: Payer: Self-pay | Admitting: Cardiovascular Disease

## 2022-05-09 DIAGNOSIS — I10 Essential (primary) hypertension: Secondary | ICD-10-CM

## 2022-05-27 ENCOUNTER — Other Ambulatory Visit: Payer: Self-pay | Admitting: Cardiovascular Disease

## 2022-05-27 DIAGNOSIS — I10 Essential (primary) hypertension: Secondary | ICD-10-CM

## 2022-06-13 ENCOUNTER — Other Ambulatory Visit: Payer: Self-pay | Admitting: Cardiovascular Disease

## 2022-06-13 DIAGNOSIS — I10 Essential (primary) hypertension: Secondary | ICD-10-CM
